# Patient Record
Sex: Male | Born: 1950
Health system: Southern US, Community
[De-identification: ages and names within clinical notes are randomized; demographics above are authoritative.]

## PROBLEM LIST (undated history)

## (undated) DIAGNOSIS — R7303 Prediabetes: Secondary | ICD-10-CM

## (undated) DIAGNOSIS — I10 Essential (primary) hypertension: Secondary | ICD-10-CM

## (undated) DIAGNOSIS — M1990 Unspecified osteoarthritis, unspecified site: Secondary | ICD-10-CM

## (undated) HISTORY — DX: Unspecified osteoarthritis, unspecified site: M19.90

## (undated) HISTORY — DX: Essential (primary) hypertension: I10

## (undated) HISTORY — DX: Prediabetes: R73.03

---

## 2008-06-17 DIAGNOSIS — I10 Essential (primary) hypertension: Secondary | ICD-10-CM | POA: Insufficient documentation

## 2008-06-17 DIAGNOSIS — M1990 Unspecified osteoarthritis, unspecified site: Secondary | ICD-10-CM | POA: Insufficient documentation

## 2008-06-17 HISTORY — DX: Essential (primary) hypertension: I10

## 2008-06-17 HISTORY — DX: Unspecified osteoarthritis, unspecified site: M19.90

## 2014-03-21 ENCOUNTER — Ambulatory Visit: Payer: Self-pay | Attending: Family Medicine

## 2014-03-25 ENCOUNTER — Encounter: Payer: Self-pay | Admitting: Internal Medicine

## 2014-03-25 ENCOUNTER — Ambulatory Visit: Payer: Self-pay | Attending: Internal Medicine | Admitting: Internal Medicine

## 2014-03-25 VITALS — BP 165/97 | HR 91 | Temp 98.1°F | Resp 16 | Wt 187.2 lb

## 2014-03-25 DIAGNOSIS — Z139 Encounter for screening, unspecified: Secondary | ICD-10-CM

## 2014-03-25 DIAGNOSIS — M069 Rheumatoid arthritis, unspecified: Secondary | ICD-10-CM | POA: Insufficient documentation

## 2014-03-25 DIAGNOSIS — Z87891 Personal history of nicotine dependence: Secondary | ICD-10-CM | POA: Insufficient documentation

## 2014-03-25 DIAGNOSIS — M2550 Pain in unspecified joint: Secondary | ICD-10-CM | POA: Insufficient documentation

## 2014-03-25 DIAGNOSIS — Z1211 Encounter for screening for malignant neoplasm of colon: Secondary | ICD-10-CM

## 2014-03-25 DIAGNOSIS — Z7689 Persons encountering health services in other specified circumstances: Secondary | ICD-10-CM | POA: Insufficient documentation

## 2014-03-25 DIAGNOSIS — I10 Essential (primary) hypertension: Secondary | ICD-10-CM | POA: Insufficient documentation

## 2014-03-25 LAB — URIC ACID: URIC ACID, SERUM: 6.4 mg/dL (ref 4.0–7.8)

## 2014-03-25 LAB — RHEUMATOID FACTOR: Rhuematoid fact SerPl-aCnc: 406 IU/mL — ABNORMAL HIGH (ref <=14)

## 2014-03-25 MED ORDER — LISINOPRIL 20 MG PO TABS: 20.0000 mg | ORAL_TABLET | Freq: Every day | ORAL | Status: DC

## 2014-03-25 NOTE — Patient Instructions (Signed)
Plan de alimentacin DASH (DASH Eating Plan) DASH es la sigla en ingls de "Enfoques Alimentarios para Detener la Hipertensin". El plan de alimentacin DASH ha demostrado bajar la presin arterial elevada (hipertensin). Los beneficios adicionales para la salud pueden incluir la disminucin del riesgo de diabetes mellitus tipo2, enfermedades cardacas e ictus. Este plan tambin puede ayudar a adelgazar. QU DEBO SABER ACERCA DEL PLAN DE ALIMENTACIN DASH? Para el plan de alimentacin DASH, seguir las siguientes pautas generales:  Elija los alimentos con un valor porcentual diario de sodio de menos del 5% (segn figura en la etiqueta del alimento).  Use hierbas o aderezos sin sal, en lugar de sal de mesa o sal marina.  Consulte al mdico o farmacutico antes de usar sustitutos de la sal.  Coma productos con bajo contenido de sodio, cuya etiqueta suele decir "bajo contenido de sodio" o "sin agregado de sal".  Coma alimentos frescos.  Coma ms verduras, frutas y productos lcteos con bajo contenido de grasas.  Elija los cereales integrales. Busque la palabra "integral" en el primer lugar de la lista de ingredientes.  Elija el pescado y el pollo o el pavo sin piel ms a menudo que las carnes rojas. Limite el consumo de pescado, carne de ave y carne a 6onzas (170g) por da.  Limite el consumo de dulces, postres, azcares y bebidas azucaradas.  Elija las grasas saludables para el corazn.  Limite el consumo de queso a 1onza (28g) por da.  Consuma ms comida casera y menos de restaurante, de buf y comida rpida.  Limite el consumo de alimentos fritos.  Cocine los alimentos utilizando mtodos que no sean la fritura.  Limite las verduras enlatadas. Si las consume, enjuguelas bien para disminuir el sodio.  Cuando coma en un restaurante, pida que preparen su comida con menos sal o, en lo posible, sin nada de sal. QU ALIMENTOS PUEDO COMER? Pida ayuda a un nutricionista para  conocer las necesidades calricas individuales. Cereales Pan de salvado o integral. Arroz integral. Pastas de salvado o integrales. Quinua, trigo burgol y cereales integrales. Cereales con bajo contenido de sodio. Tortillas de harina de maz o de salvado. Pan de maz integral. Galletas saladas integrales. Galletas con bajo contenido de sodio. Vegetales Verduras frescas o congeladas (crudas, al vapor, asadas o grilladas). Jugos de tomate y verduras con contenido bajo o reducido de sodio. Pasta y salsa de tomate con contenido bajo o reducido de sodio. Verduras enlatadas con bajo contenido de sodio o reducido de sodio.  Frutas Frutas frescas, en conserva (en su jugo natural) o frutas congeladas. Carnes y otros productos con protenas Carne de res molida (al 85% o ms magra), carne de res de animales alimentados con pastos o carne de res sin la grasa. Pollo o pavo sin piel. Carne de pollo o de pavo molida. Cerdo sin la grasa. Todos los pescados y frutos de mar. Huevos. Porotos, guisantes o lentejas secos. Frutos secos y semillas sin sal. Frijoles enlatados sin sal. Lcteos Productos lcteos con bajo contenido de grasas, como leche descremada o al 1%, quesos reducidos en grasas o al 2%, ricota con bajo contenido de grasas o queso cottage, o yogur natural con bajo contenido de grasas. Quesos con contenido bajo o reducido de sodio. Grasas y aceites Margarinas en barra que no contengan grasas trans. Mayonesa y alios para ensaladas livianos o reducidos en grasas (reducidos en sodio). [place]. Aceites de crtamo, oliva o canola. Mantequilla natural de man o almendra. Otros Palomitas de maz y pretzels sin sal.   Los artculos mencionados arriba pueden no ser una lista completa de las bebidas o los alimentos recomendados. Comunquese con el nutricionista para conocer ms opciones. QU ALIMENTOS NO SE RECOMIENDAN? Cereales Pan blanco. Pastas blancas. Arroz blanco. Pan de maz refinado. Bagels y  croissants. Galletas saladas que contengan grasas trans. Vegetales Vegetales con crema o fritos. Verduras en salsa de queso. Verduras enlatadas comunes. Pasta y salsa de tomate en lata comunes. Jugos comunes de tomate y de verduras. Frutas Frutas secas. Fruta enlatada en almbar liviano o espeso. Jugo de frutas. Carnes y otros productos con protenas Cortes de carne con grasa. Costillas, alas de pollo, tocineta, salchicha, mortadela, salame, chinchulines, tocino, perros calientes, salchichas alemanas y embutidos envasados. Frutos secos y semillas con sal. Frijoles con sal en lata. Lcteos Leche entera o al 2%, crema, mezcla de leche y crema, y queso crema. Yogur entero o endulzado. Quesos o queso azul con alto contenido de grasas. Cremas no lcteas y coberturas batidas. Quesos procesados, quesos para untar o cuajadas. Condimentos Sal de cebolla y ajo, sal condimentada, sal de mesa y sal marina. Salsas en lata y envasadas. Salsa Worcestershire. Salsa trtara. Salsa barbacoa. Salsa teriyaki. Salsa de soja, incluso la que tiene contenido reducido de sodio. Salsa de carne. Salsa de pescado. Salsa de ostras. Salsa rosada. Rbano picante. Ketchup y mostaza. Saborizantes y tiernizantes para carne. Caldo en cubitos. Salsa picante. Salsa tabasco. Adobos. Aderezos para tacos. Salsas. Grasas y aceites Mantequilla, margarina en barra, manteca de cerdo, grasa, mantequilla clarificada y grasa de tocino. Aceites de coco, de palmiste o de palma. Aderezos comunes para ensalada. Otros Pickles y aceitunas. Palomitas de maz y pretzels con sal. Los artculos mencionados arriba pueden no ser una lista completa de las bebidas y los alimentos que se deben evitar. Comunquese con el nutricionista para obtener ms informacin. DNDE PUEDO ENCONTRAR MS INFORMACIN? Instit[facility] Nacional del Corazn, del Pulmn y de la Sangre (National Heart, Lung, and Blood Institute):  www.nhlbi.nih.gov/health/health-topics/topics/dash/ Document Released: 05/23/2011 Document Revised: 10/18/2013 ExitCare Patient Information 2015 [employer]. This information is not intended to replace advice given to you by your health care provider. Make sure you discuss any questions you have with your health care provider.   

## 2014-03-25 NOTE — Progress Notes (Signed)
Patient Demographics  Armondo Cech, is a 63 y.o. male  JOA:[id number]  SWF:[id number]  DOB - 1951/01/10  CC:  Chief Complaint  Patient presents with  . Establish Care       HPI: Maximillian Habibi is a 63 y.o. male here today to establish medical care. He has to of hypertension as per patient for the last 2 months has ran out of his medication, he was taking lisinopril 20 mg daily, today's blood pressure is elevated, denies any headache dizziness chest and shortness of breath, he also reported to have history of rheumatoid arthritis, was seen specialist in the past and as per patient he also took methotrexate in the past he does have a deformity in the hands because of disease, occasionally has pain. As per patient he was also diagnosed with gout in the past. Patient has No headache, No chest pain, No abdominal pain - No Nausea, No new weakness tingling or numbness, No Cough - SOB.  No Known Allergies History reviewed. No pertinent past medical history. No current outpatient prescriptions on file prior to visit.   No current facility-administered medications on file prior to visit.   Family History  Problem Relation Age of Onset  . Heart disease Paternal Grandfather   . Arthritis Mother     Rheumatoid    History   Social History  . Marital Status: Married    Spouse Name: N/A    Number of Children: N/A  . Years of Education: N/A   Occupational History  . Not on file.   Social History Main Topics  . Smoking status: Former Games developer  . Smokeless tobacco: Not on file  . Alcohol Use: No     Comment: occasionally   . Drug Use: Not on file  . Sexual Activity: Not on file   Other Topics Concern  . Not on file   Social History Narrative  . No narrative on file    Review of Systems: Constitutional: Negative for fever, chills, diaphoresis, activity change, appetite change and fatigue. HENT: Negative for ear pain, nosebleeds, congestion, facial swelling,  rhinorrhea, neck pain, neck stiffness and ear discharge.  Eyes: Negative for pain, discharge, redness, itching and visual disturbance. Respiratory: Negative for cough, choking, chest tightness, shortness of breath, wheezing and stridor.  Cardiovascular: Negative for chest pain, palpitations and leg swelling. Gastrointestinal: Negative for abdominal distention. Genitourinary: Negative for dysuria, urgency, frequency, hematuria, flank pain, decreased urine volume, difficulty urinating and dyspareunia.  Musculoskeletal: Negative for back pain, joint swelling, arthralgia and gait problem. Neurological: Negative for dizziness, tremors, seizures, syncope, facial asymmetry, speech difficulty, weakness, light-headedness, numbness and headaches.  Hematological: Negative for adenopathy. Does not bruise/bleed easily. Psychiatric/Behavioral: Negative for hallucinations, behavioral problems, confusion, dysphoric mood, decreased concentration and agitation.    Objective:   Filed Vitals:   03/25/14 1521  BP: 165/97  Pulse: 91  Temp: 98.1 F (36.7 C)  Resp: 16    Physical Exam: Constitutional: Patient appears well-developed and well-nourished. No distress. HENT: Normocephalic, atraumatic, External right and left ear normal. Oropharynx is clear and moist.  Eyes: Conjunctivae and EOM are normal. PERRLA, no scleral icterus. Neck: Normal ROM. Neck supple. No JVD. No tracheal deviation. No thyromegaly. CVS: RRR, S1/S2 +, no murmurs, no gallops, no carotid bruit.  Pulmonary: Effort and breath sounds normal, no stridor, rhonchi, wheezes, rales.  Abdominal: Soft. BS +, no distension, tenderness, rebound or guarding.  Musculoskeletal: both hand swan neck deformities in the fingers, bilateral forearm on  the dorsal aspect palpable nodule nontender.  Neuro: Alert. Normal reflexes, muscle tone coordination. No cranial nerve deficit. Skin: Skin is warm and dry. No rash noted. Not diaphoretic. No erythema. No  pallor. Psychiatric: Normal mood and affect. Behavior, judgment, thought content normal.  No results found for this basename: WBC, HGB, HCT, MCV, PLT   No results found for this basename: CREATININE, BUN, NA, K, CL, CO2    No results found for this basename: HGBA1C   Lipid Panel  No results found for this basename: chol, trig, hdl, cholhdl, vldl, ldlcalc       Assessment and plan:   1. Essential hypertension Have resumed back on lisinopril, we'll do blood chemistry - COMPLETE METABOLIC PANEL WITH GFR; Future - lisinopril (PRINIVIL,ZESTRIL) 20 MG tablet; Take 1 tablet (20 mg total) by mouth daily.  Dispense: 30 tablet; Refill: 3  2. Screening Ordered baseline fasting blood work. - CBC with Differential; Future - Lipid panel; Future - TSH; Future - Vit D  25 hydroxy (rtn osteoporosis monitoring); Future  3. Special screening for malignant neoplasms, colon  - Ambulatory referral to Gastroenterology  4. Rheumatoid arthritis  - Ambulatory referral to Rheumatology - Uric Acid - Rheumatoid factor  5. Joint pain Tylenol/ ibuprofen prn      Health Maintenance -Colonoscopy: referred to GI  -Vaccinations:  patient is uptodate with flu shot  Return in about 3 months (around 06/25/2014) for hypertension, BP check and fasting blood work in 2 weeks/Nurse Visit.   Doris Cheadle, MD

## 2014-03-25 NOTE — Progress Notes (Signed)
Patient here to establish care Taking medication for HTN

## 2014-04-04 ENCOUNTER — Telehealth: Payer: Self-pay | Admitting: *Deleted

## 2014-04-04 NOTE — Telephone Encounter (Signed)
Message copied by Dyann Kief on Mon Apr 04, 2014  2:26 PM ------      Message from: Doris Cheadle      Created: Mon Apr 04, 2014 11:16 AM       Call and let the patient know that his uric acid is in normal range but his rheumatoid factor is elevated, patient has already been referred to rheumatology, advise patient to make appointment with the specialist. ------

## 2014-04-04 NOTE — Telephone Encounter (Signed)
Unable to LVM, voice message not set up yet

## 2016-04-29 ENCOUNTER — Ambulatory Visit: Payer: Self-pay | Attending: Internal Medicine

## 2016-06-27 ENCOUNTER — Ambulatory Visit (INDEPENDENT_AMBULATORY_CARE_PROVIDER_SITE_OTHER): Payer: Self-pay | Admitting: Internal Medicine

## 2016-06-27 ENCOUNTER — Encounter: Payer: Self-pay | Admitting: Internal Medicine

## 2016-06-27 VITALS — BP 140/82 | HR 72 | Resp 12 | Ht 69.0 in | Wt 190.0 lb

## 2016-06-27 DIAGNOSIS — R102 Pelvic and perineal pain: Secondary | ICD-10-CM

## 2016-06-27 DIAGNOSIS — M0579 Rheumatoid arthritis with rheumatoid factor of multiple sites without organ or systems involvement: Secondary | ICD-10-CM

## 2016-06-27 DIAGNOSIS — Z23 Encounter for immunization: Secondary | ICD-10-CM

## 2016-06-27 DIAGNOSIS — N390 Urinary tract infection, site not specified: Secondary | ICD-10-CM

## 2016-06-27 DIAGNOSIS — Z79899 Other long term (current) drug therapy: Secondary | ICD-10-CM

## 2016-06-27 DIAGNOSIS — I10 Essential (primary) hypertension: Secondary | ICD-10-CM

## 2016-06-27 DIAGNOSIS — R8281 Pyuria: Secondary | ICD-10-CM

## 2016-06-27 LAB — POCT URINALYSIS DIPSTICK
Bilirubin, UA: NEGATIVE
Glucose, UA: NEGATIVE
Ketones, UA: NEGATIVE
NITRITE UA: NEGATIVE
PH UA: 5
Protein, UA: 15
RBC UA: NEGATIVE
Spec Grav, UA: 1.015
Urobilinogen, UA: 0.2

## 2016-06-27 MED ORDER — LISINOPRIL 10 MG PO TABS: 10.0000 mg | ORAL_TABLET | Freq: Every day | ORAL | 11 refills | Status: DC

## 2016-06-27 NOTE — Progress Notes (Signed)
Subjective:    Patient ID: Bill Gutierrez, male    DOB: [birth year], 66 y.o.   MRN: [id number]  HPI   Here to establish  Very difficult history--meandering.  1.  Rheumatoid Arthritis:  Patient states diagnosed 8 years ago, but also that he was last treated 8 years ago.  Unable to clarify.  Previously followed by a Rheumatologist in [place].  Not clear if through [facility] and no chart from [facility] in August.  Went to this practice for 4 years.  Diagnosed at a general medicine clinic, [place], [place]. Last time he was seen was 8 years ago--2010.   Previously taking Methotrexate ?14 mg  and Folic Acid 1 mg once weekly. Seemed to have side effects with the MTX:  Describes conjunctivitis and increased joint pain, panic attacks. Not clear why medication never changed. Stopped taking when he ran out of the refills. Tried "natural" and homeopathic medication.  At some point, the pain and swelling and subcutaneous nodules of arms went away.  He does have significant deformities of fingers and hands. Has not had labs done for 2 years. Labs last done in  [place] on [address]  [facility] --not there any longer.  Does not need to take anything for the pain currently.    2.  Essential Hypertension:  Diagnosed 2010.  Last time he took anything for his bp was 1 month ago. Was taking Lisinopril 20 mg daily.  Feels he needs a lower dose.  Unclear why--perhaps because he feels it makes him urinate at night.  3.  Pelvic Pain:  Suprapubic area.  Describes the pain as a pressure.  When he loosens his pants, does not bother him.  Sounds like this started at least 2 years ago and underwent ultrasound, digital rectal exam both of which were normal.  Did have blood in urine on UA.  Treated with antibiotics and did not improve the discomfort.  Not clear what the evaluation was complete. Was sent for a colonoscopy, but could not afford.  This was with one of the [place] Then  went to [facility].  No outpatient prescriptions have been marked as taking for the 06/27/16 encounter (Office Visit) with Julieanne Manson, MD.    No Known Allergies   Past Medical History:  Diagnosis Date  . Hypertension 2010  . rheumatoid 2010   History reviewed. No pertinent surgical history.   Review of Systems     Objective:   Physical Exam  NAD HEENT:  PERRL, EOMI, TMs pearly gray, throat without injection Neck:  Supple, No adenopathy, no thyromegaly Chest:  CTA CV:  RRR with normal S1 and S2, No S3, S4 or murmur, radial and DP pulses normal and equal Abd;  S, Mild tenderness lower abdomen in midline suprapubic area, no rebound, peritoneal sign, No HSM or mass, + BS MS:  Significant swan neck deformities of fingers, with enlarged MCPs and ulnar deviation of digits.  Non-inflamed nodule on left olecranon. Deformity of toes as well.  Wrists hypertrophic as well.  Palpable soft tissue swelling.  No erythema of joints.         Assessment & Plan:  1.  Rheumatoid Arthritis:  High RF 2 years ago, above 400.  Check sed rate.  Appears to be quiescent with significant deformities.   Consider referral to [facility] if need for treatment  2.  Essential Hypertension: Borderline bp today.  Try decreased dose of Lisinopril 10 mg  daily. CMP  3.  Suprapubic discomfort, which appears quite mild.  UA with small leuks.  Sending for culture.  4.  HM:  Tdap today.

## 2016-06-27 NOTE — Patient Instructions (Signed)
Sign releases of info at check out--call back with name of doctors you used to go to and we will send for records:  [facility], Doctor in [place].

## 2016-06-28 LAB — COMPREHENSIVE METABOLIC PANEL
ALBUMIN: 4.1 g/dL (ref 3.6–4.8)
ALT: 24 IU/L (ref 0–44)
AST: 24 IU/L (ref 0–40)
Albumin/Globulin Ratio: 1 — ABNORMAL LOW (ref 1.2–2.2)
Alkaline Phosphatase: 105 IU/L (ref 39–117)
BUN / CREAT RATIO: 17 (ref 10–24)
BUN: 14 mg/dL (ref 8–27)
Bilirubin Total: 0.2 mg/dL (ref 0.0–1.2)
CO2: 23 mmol/L (ref 18–29)
Calcium: 9.6 mg/dL (ref 8.6–10.2)
Chloride: 96 mmol/L (ref 96–106)
Creatinine, Ser: 0.82 mg/dL (ref 0.76–1.27)
GFR calc non Af Amer: 94 mL/min/{1.73_m2} (ref >59)
GFR, EST AFRICAN AMERICAN: 109 mL/min/{1.73_m2} (ref >59)
GLOBULIN, TOTAL: 4.1 g/dL (ref 1.5–4.5)
Glucose: 116 mg/dL — ABNORMAL HIGH (ref 65–99)
Potassium: 4.3 mmol/L (ref 3.5–5.2)
Sodium: 136 mmol/L (ref 134–144)
Total Protein: 8.2 g/dL (ref 6.0–8.5)

## 2016-06-28 LAB — SEDIMENTATION RATE: Sed Rate: 40 mm/hr — ABNORMAL HIGH (ref 0–30)

## 2016-06-29 LAB — URINE CULTURE: Organism ID, Bacteria: NO GROWTH

## 2016-07-31 NOTE — Progress Notes (Signed)
Patient was given results from labs and has an appoint scheduled for 08/01/16 to check A1C

## 2016-08-01 ENCOUNTER — Other Ambulatory Visit (INDEPENDENT_AMBULATORY_CARE_PROVIDER_SITE_OTHER): Payer: Self-pay

## 2016-08-01 DIAGNOSIS — R739 Hyperglycemia, unspecified: Secondary | ICD-10-CM

## 2016-08-02 LAB — HGB A1C W/O EAG: Hgb A1c MFr Bld: 5.8 % — ABNORMAL HIGH (ref 4.8–5.6)

## 2016-08-14 NOTE — Progress Notes (Signed)
Patient was called with lab results and follow up appointment information

## 2016-08-29 ENCOUNTER — Ambulatory Visit (INDEPENDENT_AMBULATORY_CARE_PROVIDER_SITE_OTHER): Payer: Self-pay | Admitting: Internal Medicine

## 2016-08-29 ENCOUNTER — Encounter: Payer: Self-pay | Admitting: Internal Medicine

## 2016-08-29 VITALS — BP 142/82 | HR 80 | Resp 12 | Ht 69.25 in | Wt 191.0 lb

## 2016-08-29 DIAGNOSIS — M0579 Rheumatoid arthritis with rheumatoid factor of multiple sites without organ or systems involvement: Secondary | ICD-10-CM

## 2016-08-29 DIAGNOSIS — I10 Essential (primary) hypertension: Secondary | ICD-10-CM

## 2016-08-29 DIAGNOSIS — R7303 Prediabetes: Secondary | ICD-10-CM

## 2016-08-29 DIAGNOSIS — R8290 Unspecified abnormal findings in urine: Secondary | ICD-10-CM

## 2016-08-29 HISTORY — DX: Prediabetes: R73.03

## 2016-08-29 NOTE — Patient Instructions (Signed)

## 2016-08-29 NOTE — Progress Notes (Signed)
   Subjective:    Patient ID: Bill Gutierrez, male    DOB: [birth year], 66 y.o.   MRN: [id number]  HPI   1.  RA:  Sed Rate was just above normal at 40.  Discussed RA does not appear to be active.  Stated at last visit he was not really symptomatic.  Continues on some sort of herbal remedy of which I am unable to interpret in [place]. No real pain, but does have old deformities.     2.  Prediabetes:  A1C was 5.8% with labs in January.  Checked this due to elevated fasting blood sugar.  Describes drinking juices with vegetables and fruits--juices these foods, does not blend them with the whole fruit.  Has really stopped drinking juices.    Eats twice daily  Morning:  Rice and beans and vegetables, lamb or chicken.  Evening:  Similar meal.    Eats a lot of bread.  Current physical activity:  Nothing.  Discussed swimming or stationary bike.  3.  Low pelvic/suprapubic pain:  Urine was negative.  Does states has a lot of bubbles in urine.  Has noted this for 2 years.    4.  Essential Hypertension:  Not taking his Lisinopril every day.  He is deciding whether to take his medicine on a day to day basis --not clear what makes him decide to take the medicine or not.  Current Meds  Medication Sig  . lisinopril (PRINIVIL,ZESTRIL) 10 MG tablet Take 1 tablet (10 mg total) by mouth daily.    No Known Allergies             Review of Systems     Objective:   Physical Exam  NAD Lungs:  CTA CV:  RRR without murmur or rub, radial pulses normal and equal.  LE:  No edema.        Assessment & Plan:  1. RA: appears to be quiescent currently.  Patient to bring in all supplements to see what he is taking and will decide what to do with those--natural alternatives or not.  2.  Essential Hypertension: discussed he needs to take this once daily and not skip.  Need to see if taken correctly if will control bp.  3.  Prediabetes:  Long discussion regarding diet and finding something like  pool exercise or swimming to be physically active without damaging joints further.    4.  Bubbles in urine:  Check urine microalbumin/crea ratio.  Suspect will be normal. Follow up in 3 months for CPE.

## 2016-08-30 LAB — MICROALBUMIN / CREATININE URINE RATIO
Creatinine, Urine: 141.8 mg/dL
MICROALBUM., U, RANDOM: 3.9 ug/mL
Microalb/Creat Ratio: 2.8 mg/g creat (ref 0.0–30.0)

## 2016-10-05 ENCOUNTER — Emergency Department (HOSPITAL_COMMUNITY): Payer: Self-pay

## 2016-10-05 ENCOUNTER — Emergency Department (HOSPITAL_COMMUNITY)
Admission: EM | Admit: 2016-10-05 | Discharge: 2016-10-05 | Disposition: A | Discharge status: Home / Self Care | Payer: Self-pay | Attending: Emergency Medicine | Admitting: Emergency Medicine

## 2016-10-05 DIAGNOSIS — Z79899 Other long term (current) drug therapy: Secondary | ICD-10-CM | POA: Insufficient documentation

## 2016-10-05 DIAGNOSIS — I10 Essential (primary) hypertension: Secondary | ICD-10-CM | POA: Insufficient documentation

## 2016-10-05 DIAGNOSIS — R002 Palpitations: Secondary | ICD-10-CM

## 2016-10-05 DIAGNOSIS — F419 Anxiety disorder, unspecified: Secondary | ICD-10-CM | POA: Insufficient documentation

## 2016-10-05 DIAGNOSIS — Z87891 Personal history of nicotine dependence: Secondary | ICD-10-CM | POA: Insufficient documentation

## 2016-10-05 LAB — BASIC METABOLIC PANEL
Anion gap: 10 (ref 5–15)
BUN: 12 mg/dL (ref 6–20)
CALCIUM: 8.9 mg/dL (ref 8.9–10.3)
CO2: 21 mmol/L — ABNORMAL LOW (ref 22–32)
CREATININE: 0.73 mg/dL (ref 0.61–1.24)
Chloride: 101 mmol/L (ref 101–111)
GFR calc non Af Amer: >60 mL/min (ref >60)
Glucose, Bld: 181 mg/dL — ABNORMAL HIGH (ref 65–99)
Potassium: 3.7 mmol/L (ref 3.5–5.1)
SODIUM: 132 mmol/L — AB (ref 135–145)

## 2016-10-05 LAB — CBC
HCT: 41 % (ref 39.0–52.0)
Hemoglobin: 13.3 g/dL (ref 13.0–17.0)
MCH: 24.2 pg — AB (ref 26.0–34.0)
MCHC: 32.4 g/dL (ref 30.0–36.0)
MCV: 74.5 fL — ABNORMAL LOW (ref 78.0–100.0)
PLATELETS: 398 10*3/uL (ref 150–400)
RBC: 5.5 MIL/uL (ref 4.22–5.81)
RDW: 14.2 % (ref 11.5–15.5)
WBC: 9.6 10*3/uL (ref 4.0–10.5)

## 2016-10-05 LAB — I-STAT TROPONIN, ED: Troponin i, poc: 0 ng/mL (ref 0.00–0.08)

## 2016-10-05 MED ORDER — LISINOPRIL 10 MG PO TABS
10.0000 mg | ORAL_TABLET | Freq: Once | ORAL | Status: DC
  Filled 2016-10-05: qty 1

## 2016-10-05 NOTE — Discharge Instructions (Signed)
As discussed, I would like you to increase your lisinopril to 20 mg daily.  Take this at the same time.  He normally do and only take your blood pressure 1 time a day, approximately 3-4 hours after your medication make an appointment with your primary care physician to be seen.  The end of next week   Como se discuti, me gustara que aumente su lisinopril a 20 mg al da. Toma esto al [employer]. Normalmente lo hace y solo toma su presin arterial 1 vez al da, aproximadamente 3-4 horas despus de su medicacin, haga una cita con su mdico de atencin primaria para que lo vean. El fin de la prxima semana

## 2016-10-05 NOTE — ED Provider Notes (Signed)
[facility]-EMERGENCY DEPT Provider Note   CSN: [id number] Arrival date & time: 10/05/16  1759     History   Chief Complaint Chief Complaint  Patient presents with  . Chest Pain  . Palpitations  . Shortness of Breath    HPI Bill Gutierrez is a 66 y.o. male.  (Interpreter services were used during interview )This is a soft and extremely anxious 66 year old Hispanic male who comes in reporting that he's been having palpitations on and off for the past 2 weeks and his blood pressure has been elevated 150/90 at home.  He's taken a second lisinopril 10 mg tablet occasionally to get his blood pressure daily down.  He reports that he takes his blood pressure numerous times in a row.  If there is a high reading. Denies any shortness of breath, diaphoresis, nausea, peripheral swelling, chest pain. As endorse, rheumatoid arthritis pain in his arms, shoulders and back, for which she is taking homeopathic medications.  At one point he was taking methotrexate but he didn't like the side effects or stop this per his rheumatologist.      Past Medical History:  Diagnosis Date  . Hypertension 2010  . Prediabetes 08/29/2016  . rheumatoid 2010    Patient Active Problem List   Diagnosis Date Noted  . Prediabetes 08/29/2016  . Essential hypertension 03/25/2014  . Rheumatoid arthritis (HCC) 03/25/2014  . rheumatoid 06/17/2008  . Hypertension 06/17/2008    No past surgical history on file.     Home Medications    Prior to Admission medications   Medication Sig Start Date End Date Taking? Authorizing Provider  lisinopril (PRINIVIL,ZESTRIL) 10 MG tablet Take 1 tablet (10 mg total) by mouth daily. 06/27/16   Julieanne Manson, MD    Family History Family History  Problem Relation Age of Onset  . Heart disease Paternal Grandfather   . Arthritis Mother     Rheumatoid   . Mental illness Sister     Social History Social History  Substance Use Topics  . Smoking status: Former  Smoker    Years: 15.00    Types: Cigarettes  . Smokeless tobacco: Never Used  . Alcohol use No     Comment: Stopped drinking completely when RA     Allergies   Patient has no known allergies.   Review of Systems Review of Systems  Constitutional: Negative for chills, fatigue and fever.  Respiratory: Negative for shortness of breath.   Cardiovascular: Positive for palpitations. Negative for chest pain.  Musculoskeletal: Positive for arthralgias and back pain.  All other systems reviewed and are negative.    Physical Exam Updated Vital Signs BP (!) 157/98   Pulse 91   Temp 97.7 F (36.5 C) (Oral)   Resp 10   SpO2 97%   Physical Exam  Constitutional: He is oriented to person, place, and time. He appears well-developed and well-nourished. No distress.  HENT:  Head: Normocephalic.  Right Ear: External ear normal.  Left Ear: External ear normal.  Eyes: Pupils are equal, round, and reactive to light.  Neck: Normal range of motion.  Cardiovascular: Normal rate.  Exam reveals no gallop.   No murmur heard. Pulmonary/Chest: Effort normal.  Abdominal: Soft.  Neurological: He is alert and oriented to person, place, and time.  Nursing note and vitals reviewed.    ED Treatments / Results  Labs (all labs ordered are listed, but only abnormal results are displayed) Labs Reviewed  BASIC METABOLIC PANEL - Abnormal; Notable for the following:  Result Value   Sodium 132 (*)    CO2 21 (*)    Glucose, Bld 181 (*)    All other components within normal limits  CBC - Abnormal; Notable for the following:    MCV 74.5 (*)    MCH 24.2 (*)    All other components within normal limits  I-STAT TROPOININ, ED    EKG  EKG Interpretation None       Radiology Dg Chest 2 View  Result Date: 10/05/2016 CLINICAL DATA:  Mid thoracic back pain and palpitations today. Hypertension. Ex-smoker. EXAM: CHEST  2 VIEW COMPARISON:  None. FINDINGS: Normal sized heart. Tortuous aorta and  innominate artery. Clear lungs. Mild diffuse peribronchial thickening. Diffuse osteopenia. IMPRESSION: No acute abnormality.  Mild chronic bronchitic changes. Electronically Signed   By: Beckie Salts M.D.   On: 10/05/2016 18:26    Procedures Procedures (including critical care time)  Medications Ordered in ED Medications  lisinopril (PRINIVIL,ZESTRIL) tablet 10 mg (not administered)     Initial Impression / Assessment and Plan / ED Course  I have reviewed the triage vital signs and the nursing notes.  Pertinent labs & imaging results that were available during my care of the patient were reviewed by me and considered in my medical decision making (see chart for details).      Reviewed all of his labs his EKG and chest x-ray with the help of the interpreter if reassured this patient. I've instructed the patient to increase his lisinopril to 20 mg daily.  He states he normally takes his medication at 3 PM, which is fine.  I want to take his blood pressure once a day between 7 and 8 at night and keep her documentation of this and make an appointment with his primary care physician to be seen.  The end of next week.  Final Clinical Impressions(s) / ED Diagnoses   Final diagnoses:  Palpitations  Anxiety  Hypertension, unspecified type    New Prescriptions New Prescriptions   No medications on file     Earley Favor, NP 10/05/16 2204    Alvira Monday, MD 10/07/16 1210

## 2016-10-05 NOTE — ED Triage Notes (Signed)
Pt reports mid chest discomfort and palpitations x 2 days with mild sob. ekg done at triage and no resp distress noted.

## 2016-11-12 ENCOUNTER — Encounter: Payer: Self-pay | Admitting: Pediatric Intensive Care

## 2016-11-12 DIAGNOSIS — I10 Essential (primary) hypertension: Secondary | ICD-10-CM

## 2016-11-14 LAB — GLUCOSE, POCT (MANUAL RESULT ENTRY): POC Glucose: 109 mg/dl — AB (ref 70–99)

## 2016-11-14 NOTE — Congregational Nurse Program (Signed)
Congregational Nurse Program Note  Date of Encounter: 11/12/2016  Past Medical History: Past Medical History:  Diagnosis Date  . Hypertension 2010  . Prediabetes 08/29/2016  . rheumatoid 2010    Encounter Details:     CNP Questionnaire - 11/12/16 1600      Patient Demographics   Is this a new or existing patient? 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 Not Applicable   Does patient have a medical provider? Yes   Patient referred to Follow up with established PCP   Was a mental health screening completed? (GAINS tool) No   Does patient have dental issues? No   Does patient have vision issues? No   Does your patient have an abnormal blood pressure today? Yes   Since previous encounter, have you referred patient for abnormal blood pressure that resulted in a new diagnosis or medication change? No   Does your patient have an abnormal blood glucose today? No   Since previous encounter, have you referred patient for abnormal blood glucose that resulted in a new diagnosis or medication change? No   Was there a life-saving intervention made? No     Via interpreter Marchelle Folks- client in for BP/BG check. States he has history of hypertension and takes lisinopril. He is a patient at [facility] . Client will follow up with PCP on scheduled appointment date. Return to CN clinic as needed  for BP check.

## 2016-12-05 ENCOUNTER — Encounter: Payer: Self-pay | Admitting: Internal Medicine

## 2017-01-16 ENCOUNTER — Encounter: Payer: Self-pay | Admitting: Internal Medicine

## 2017-03-27 ENCOUNTER — Ambulatory Visit: Payer: Self-pay | Admitting: Internal Medicine

## 2017-07-04 ENCOUNTER — Ambulatory Visit: Payer: Self-pay | Attending: Internal Medicine

## 2017-09-15 ENCOUNTER — Encounter (INDEPENDENT_AMBULATORY_CARE_PROVIDER_SITE_OTHER): Payer: Self-pay | Admitting: Physician Assistant

## 2017-09-15 ENCOUNTER — Ambulatory Visit: Payer: Self-pay | Attending: Physician Assistant

## 2017-09-15 ENCOUNTER — Ambulatory Visit (INDEPENDENT_AMBULATORY_CARE_PROVIDER_SITE_OTHER): Payer: Self-pay | Admitting: Physician Assistant

## 2017-09-15 ENCOUNTER — Other Ambulatory Visit: Payer: Self-pay

## 2017-09-15 VITALS — BP 150/87 | HR 64 | Temp 97.4°F | Ht 70.0 in | Wt 170.4 lb

## 2017-09-15 DIAGNOSIS — I10 Essential (primary) hypertension: Secondary | ICD-10-CM

## 2017-09-15 DIAGNOSIS — M0579 Rheumatoid arthritis with rheumatoid factor of multiple sites without organ or systems involvement: Secondary | ICD-10-CM

## 2017-09-15 DIAGNOSIS — R1024 Suprapubic pain: Secondary | ICD-10-CM

## 2017-09-15 DIAGNOSIS — R634 Abnormal weight loss: Secondary | ICD-10-CM

## 2017-09-15 DIAGNOSIS — R102 Pelvic and perineal pain: Secondary | ICD-10-CM

## 2017-09-15 LAB — POCT URINALYSIS DIPSTICK
Bilirubin, UA: NEGATIVE
Glucose, UA: NEGATIVE
Ketones, UA: NEGATIVE
Leukocytes, UA: NEGATIVE
Nitrite, UA: NEGATIVE
PH UA: 7 (ref 5.0–8.0)
PROTEIN UA: NEGATIVE
Spec Grav, UA: 1.015 (ref 1.010–1.025)
UROBILINOGEN UA: 0.2 U/dL

## 2017-09-15 MED ORDER — FOLIC ACID 1 MG PO TABS: 1.0000 mg | ORAL_TABLET | Freq: Every day | ORAL | 2 refills | Status: DC

## 2017-09-15 MED ORDER — SULFASALAZINE 500 MG PO TABS: 500.0000 mg | ORAL_TABLET | Freq: Every day | ORAL | 2 refills | Status: DC

## 2017-09-15 MED FILL — FOLIC ACID 1 MG TABLET: 1 | 30 days supply | Qty: 30 | Fill #0

## 2017-09-15 MED FILL — sulfaSALAzine 500 MG TABS: 500 | 30 days supply | Qty: 30 | Fill #0

## 2017-09-15 NOTE — Progress Notes (Signed)
Subjective:  Patient ID: Bill Gutierrez, male    DOB: 1951-04-11  Age: 67 y.o. MRN: [id number]  CC: HTN and RA. Lower abd pain  HPI Bill Gutierrez is a 67 y.o. male with a medical history of anixety, palpitations, HTN, prediabetes, and rheumatoid arthritis presents as a new patient with complaint of lower abdominal pain. Described as "something that does not belong in my body". Sensation felt in the suprapubic region. Does not describe urinary difficulty but does describe occasional difficulty defecating.. Had a colonoscopy ordered before but says he did not have the money to pay for the procedure and never had done. Can not work due to his RA. Endorses weight loss from "36 to 34", referring to his pant waist size. Few episodes of night sweats attributed to past medication use.      In regards to RA, patient can not work due to he hand pain and deformity. Had tried Methotrexate and Folic Acid but there were side effects to his vision. Was advised to use Naproxen but he did not do so. Pains have been felt in the knees, shoulder, elbows, and hips. Current overall pain level 0/10.      BP noted to be elevated today but he says that he has bought a blood pressure monitor to use a home. Tests BP TID and reports pressures in the 110s/70s at home. Does not endorse CP, palpitations, SOB, HA, abdominal pain, tingling, numbness, weakness, f/c/n/v, or rash.      Outpatient Medications Prior to Visit  Medication Sig Dispense Refill  . lisinopril (PRINIVIL,ZESTRIL) 10 MG tablet Take 1 tablet (10 mg total) by mouth daily. 30 tablet 11   No facility-administered medications prior to visit.      ROS Review of Systems  Constitutional: Positive for diaphoresis and weight loss. Negative for chills, fever and malaise/fatigue.  Eyes: Negative for blurred vision.  Respiratory: Negative for shortness of breath.   Cardiovascular: Negative for chest pain and palpitations.  Gastrointestinal: Positive  for constipation. Negative for abdominal pain, nausea and vomiting.  Genitourinary: Negative for dysuria and hematuria.  Musculoskeletal: Positive for joint pain. Negative for myalgias.  Skin: Negative for rash.  Neurological: Negative for tingling and headaches.  Psychiatric/Behavioral: Negative for depression. The patient is not nervous/anxious.     Objective:  BP (!) 150/87 (BP Location: Left Arm, Patient Position: Sitting, Cuff Size: Normal)   Pulse 64   Temp (!) 97.4 F (36.3 C) (Oral)   Ht 5\' 10"  (1.778 m)   Wt 170 lb 6.4 oz (77.3 kg)   SpO2 98%   BMI 24.45 kg/m   BP/Weight 09/15/2017 11/12/2016 10/05/2016  Systolic BP 150 140 157  Diastolic BP 87 91 98  Wt. (Lbs) 170.4 - -  BMI 24.45 - -      Physical Exam  Constitutional: He is oriented to person, place, and time.  Well developed, thin, NAD, polite, articulate  HENT:  Head: Normocephalic and atraumatic.  Eyes: No scleral icterus.  Neck: Normal range of motion. Neck supple. No thyromegaly present.  Cardiovascular: Normal rate, regular rhythm and normal heart sounds.  Pulmonary/Chest: Effort normal and breath sounds normal. No respiratory distress. He has no wheezes.  Abdominal: Soft. Bowel sounds are normal. There is tenderness (mild suprapubic TTP with firm mass felt over suprapubic region).  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 He exhibits no edema.  Ulnar deviation of the fingers bilaterally with bony hypertrophy of the  MCPjs and PIPjs  Neurological: He is alert and oriented to person, place, and time.  Skin: Skin is warm and dry. No rash noted. No erythema. No pallor.  Psychiatric: He has a normal mood and affect. His behavior is normal. Thought content normal.  Vitals reviewed.    Assessment & Plan:    1. Rheumatoid arthritis involving multiple sites with positive rheumatoid factor (HCC) - Sedimentation Rate; Future - C-reactive protein;  Future - Hepatitis panel, acute; Future - QuantiFERON-TB Gold Plus; Future - CYCLIC CITRUL PEPTIDE ANTIBODY, IGG/IGA - DG Chest 2 View; Future - sulfaSALAzine (AZULFIDINE) 500 MG tablet; Take 1 tablet (500 mg total) by mouth daily.  Dispense: 30 tablet; Refill: 2 - folic acid (FOLVITE) 1 MG tablet; Take 1 tablet (1 mg total) by mouth daily.  Dispense: 30 tablet; Refill: 2 - Ambulatory referral to Rheumatology  2. Suprapubic abdominal pain - PSA; Future - Urinalysis Dipstick with moderate blood - Fecal occult blood, imunochemical - Comprehensive metabolic panel; Future - CBC with Differential; Future  3. Essential hypertension - Comprehensive metabolic panel; Future - CBC with Differential; Future - TSH; Future  4. Loss of weight - Need to assess PSA and FIT     Meds ordered this encounter  Medications  . sulfaSALAzine (AZULFIDINE) 500 MG tablet    Sig: Take 1 tablet (500 mg total) by mouth daily.    Dispense:  30 tablet    Refill:  2    Order Specific Question:   Supervising Provider    Answer:   Quentin Angst [id number]  . folic acid (FOLVITE) 1 MG tablet    Sig: Take 1 tablet (1 mg total) by mouth daily.    Dispense:  30 tablet    Refill:  2    Order Specific Question:   Supervising Provider    Answer:   Quentin Angst [[id number]]    Follow-up:  4 weeks  Loletta Specter PA

## 2017-09-15 NOTE — Patient Instructions (Signed)
   Artritis reumatoide (Rheumatoid Arthritis) La artritis reumatoide (AR) es una enfermedad a largo plazo (crnica). La AR causa inflamacin en las articulaciones. Las articulaciones pueden estar doloridas, rgidas, hinchadas, calientes o sensibles a la palpacin. La AR puede comenzar lentamente. A menudo, afecta las articulaciones pequeas de las manos y los pies. Tambin puede afectar otras partes del cuerpo, incluso el corazn, los ojos o los pulmones. Los sntomas de la AR suelen ser intermitentes. En ocasiones, los sntomas empeoran durante un perodo. Estos perodos se denominan crisis. La AR no tiene [place], pero el mdico trabajar con usted para encontrar la opcin de tratamiento ms adecuada para su caso. Esto depender de la evolucin de la enfermedad en su organismo. CUIDADOS EN EL HOGAR  Tome los medicamentos de venta libre y los recetados solamente como se lo haya indicado el mdico. El mdico puede ajustarle los medicamentos cada 3meses.  Comience un programa de actividad fsica segn las indicaciones del mdico.  Descanse cuando tenga una crisis.  Retome sus actividades habituales como se lo haya indicado el mdico. Pregntele al mdico qu actividades son seguras para usted.  Concurra a todas las visitas de control como se lo haya indicado el mdico. Esto es importante. SOLICITE AYUDA SI:  Tiene una crisis.  Tiene fiebre.  Tiene problemas (efectos secundarios) debido a los medicamentos. SOLICITE AYUDA DE INMEDIATO SI:  Siente dolor en el pecho.  Tiene dificultad para respirar.  Tiene calor y dolor en una articulacin de forma repentina, y esto es ms intenso que sus dolores articulares habituales. Esta informacin no tiene como fin reemplazar el consejo del mdico. Asegrese de hacerle al mdico cualquier pregunta que tenga. Document Released: 12/03/2011 Document Revised: 09/25/2015 Document Reviewed: 03/16/2015 Elsevier Interactive Patient Education  2018 Elsevier  Inc.  

## 2017-09-18 LAB — QUANTIFERON-TB GOLD PLUS
QuantiFERON Mitogen Value: 7.42 IU/mL
QuantiFERON Nil Value: 0.01 IU/mL
QuantiFERON TB1 Ag Value: 0.02 IU/mL
QuantiFERON TB2 Ag Value: 0.02 IU/mL
QuantiFERON-TB Gold Plus: NEGATIVE

## 2017-09-18 LAB — CBC WITH DIFFERENTIAL/PLATELET
BASOS: 0 %
Basophils Absolute: 0 10*3/uL (ref 0.0–0.2)
EOS (ABSOLUTE): 0.2 10*3/uL (ref 0.0–0.4)
EOS: 2 %
HEMATOCRIT: 43.5 % (ref 37.5–51.0)
HEMOGLOBIN: 14.1 g/dL (ref 13.0–17.7)
Immature Grans (Abs): 0 10*3/uL (ref 0.0–0.1)
Immature Granulocytes: 0 %
LYMPHS ABS: 1.2 10*3/uL (ref 0.7–3.1)
Lymphs: 15 %
MCH: 24.5 pg — ABNORMAL LOW (ref 26.6–33.0)
MCHC: 32.4 g/dL (ref 31.5–35.7)
MCV: 76 fL — AB (ref 79–97)
MONOCYTES: 5 %
MONOS ABS: 0.4 10*3/uL (ref 0.1–0.9)
Neutrophils Absolute: 6.6 10*3/uL (ref 1.4–7.0)
Neutrophils: 78 %
Platelets: 294 10*3/uL (ref 150–379)
RBC: 5.76 x10E6/uL (ref 4.14–5.80)
RDW: 15.5 % — AB (ref 12.3–15.4)
WBC: 8.4 10*3/uL (ref 3.4–10.8)

## 2017-09-18 LAB — COMPREHENSIVE METABOLIC PANEL
A/G RATIO: 1.1 — AB (ref 1.2–2.2)
ALBUMIN: 4 g/dL (ref 3.6–4.8)
ALK PHOS: 112 IU/L (ref 39–117)
ALT: 16 IU/L (ref 0–44)
AST: 19 IU/L (ref 0–40)
BUN / CREAT RATIO: 16 (ref 10–24)
BUN: 11 mg/dL (ref 8–27)
Bilirubin Total: 0.3 mg/dL (ref 0.0–1.2)
CALCIUM: 9 mg/dL (ref 8.6–10.2)
CO2: 21 mmol/L (ref 20–29)
CREATININE: 0.69 mg/dL — AB (ref 0.76–1.27)
Chloride: 102 mmol/L (ref 96–106)
GFR calc Af Amer: 116 mL/min/{1.73_m2} (ref >59)
GFR, EST NON AFRICAN AMERICAN: 100 mL/min/{1.73_m2} (ref >59)
GLOBULIN, TOTAL: 3.8 g/dL (ref 1.5–4.5)
Glucose: 95 mg/dL (ref 65–99)
POTASSIUM: 4.2 mmol/L (ref 3.5–5.2)
SODIUM: 138 mmol/L (ref 134–144)
Total Protein: 7.8 g/dL (ref 6.0–8.5)

## 2017-09-18 LAB — HEPATITIS PANEL, ACUTE
HEP B C IGM: NEGATIVE
HEP B S AG: NEGATIVE
Hep A IgM: NEGATIVE

## 2017-09-18 LAB — PSA: Prostate Specific Ag, Serum: 1.5 ng/mL (ref 0.0–4.0)

## 2017-09-18 LAB — CYCLIC CITRUL PEPTIDE ANTIBODY, IGG/IGA: Cyclic Citrullin Peptide Ab: >250 units — ABNORMAL HIGH (ref 0–19)

## 2017-09-18 LAB — FECAL OCCULT BLOOD, IMMUNOCHEMICAL: Fecal Occult Bld: NEGATIVE

## 2017-09-18 LAB — SEDIMENTATION RATE: SED RATE: 77 mm/h — AB (ref 0–30)

## 2017-09-18 LAB — TSH: TSH: 4 u[IU]/mL (ref 0.450–4.500)

## 2017-09-18 LAB — C-REACTIVE PROTEIN: CRP: 5.4 mg/L — AB (ref 0.0–4.9)

## 2017-09-23 ENCOUNTER — Telehealth (INDEPENDENT_AMBULATORY_CARE_PROVIDER_SITE_OTHER): Payer: Self-pay

## 2017-09-23 NOTE — Telephone Encounter (Signed)
Patient is aware of negative hepatitis, TB, FIT normal PSA and only inflammation due to his RA. [name], CMA   Patient was provided results by [employer] Staff- Rosalio Loud. [name], CMA

## 2017-09-23 NOTE — Telephone Encounter (Signed)
-----   Message from Loletta Specter, PA-C sent at 09/19/2017  1:11 PM EDT ----- FIT negative, PSA normal, TB negative, Hep panel negative. Only inflammation due to his RA (rheumatoid arthritis).

## 2017-09-29 ENCOUNTER — Ambulatory Visit: Payer: Self-pay

## 2017-10-13 ENCOUNTER — Ambulatory Visit: Payer: Self-pay | Attending: Family Medicine

## 2017-10-15 ENCOUNTER — Encounter (INDEPENDENT_AMBULATORY_CARE_PROVIDER_SITE_OTHER): Payer: Self-pay | Admitting: Physician Assistant

## 2017-10-15 ENCOUNTER — Ambulatory Visit (INDEPENDENT_AMBULATORY_CARE_PROVIDER_SITE_OTHER): Payer: Self-pay | Admitting: Physician Assistant

## 2017-10-15 VITALS — BP 146/78 | HR 61 | Temp 98.1°F | Ht 70.0 in | Wt 172.4 lb

## 2017-10-15 DIAGNOSIS — Z114 Encounter for screening for human immunodeficiency virus [HIV]: Secondary | ICD-10-CM

## 2017-10-15 DIAGNOSIS — M069 Rheumatoid arthritis, unspecified: Secondary | ICD-10-CM

## 2017-10-15 DIAGNOSIS — Z79899 Other long term (current) drug therapy: Secondary | ICD-10-CM

## 2017-10-15 DIAGNOSIS — I10 Essential (primary) hypertension: Secondary | ICD-10-CM

## 2017-10-15 MED ORDER — FOLIC ACID 1 MG PO TABS: 1.0000 mg | ORAL_TABLET | Freq: Every day | ORAL | 5 refills | Status: AC

## 2017-10-15 MED ORDER — SULFASALAZINE 500 MG PO TABS: 500.0000 mg | ORAL_TABLET | Freq: Every day | ORAL | 5 refills | Status: AC

## 2017-10-15 MED ORDER — LISINOPRIL 40 MG PO TABS: 40.0000 mg | ORAL_TABLET | Freq: Every day | ORAL | 11 refills | Status: DC

## 2017-10-15 NOTE — Patient Instructions (Signed)
  Artritis reumatoide (Rheumatoid Arthritis) La artritis reumatoide (AR) es una enfermedad a largo plazo (crnica). La AR causa inflamacin en las articulaciones. Las articulaciones pueden estar doloridas, rgidas, hinchadas, calientes o sensibles a la palpacin. La AR puede comenzar lentamente. A menudo, afecta las articulaciones pequeas de las manos y los pies. Tambin puede afectar otras partes del cuerpo, incluso el corazn, los ojos o los pulmones. Los sntomas de la AR suelen ser intermitentes. En ocasiones, los sntomas empeoran durante un perodo. Estos perodos se denominan crisis. La AR no tiene [place], pero el mdico trabajar con usted para Clinical research associate la opcin de tratamiento ms [place] para su caso. Esto depender de la evolucin de la enfermedad en su organismo. CUIDADOS EN EL HOGAR  Tome los medicamentos de venta libre y los recetados solamente como se lo haya indicado el mdico. El mdico puede ajustarle los medicamentos cada .  Comience un programa de actividad fsica segn las indicaciones del mdico.  Descanse cuando tenga una crisis.  Retome sus actividades habituales como se lo haya indicado el mdico. Pregntele al mdico qu actividades son seguras para usted.  Concurra a todas las visitas de control como se lo haya indicado el mdico. Esto es importante. SOLICITE AYUDA SI:  Tiene una crisis.  Tiene fiebre.  Tiene problemas (efectos secundarios) debido a los medicamentos. SOLICITE AYUDA DE INMEDIATO SI:  Siente dolor en el pecho.  Tiene dificultad para respirar.  Tiene calor y dolor en una articulacin de forma repentina, y esto es ms intenso que sus dolores articulares habituales. Esta informacin no tiene Theme park manager el consejo del mdico. Asegrese de hacerle al mdico cualquier pregunta que tenga. Document Released: 12/03/2011 Document Revised: 09/25/2015 Document Reviewed: 03/16/2015 Elsevier Interactive Patient Education  [employer].

## 2017-10-15 NOTE — Progress Notes (Signed)
Subjective:  Patient ID: Bill Gutierrez, male    DOB: 1951-06-14  Age: 67 y.o. MRN: [id number]  CC: f/u RA  HPI  [name] is a 67 y.o. male with a medical history of anixety, palpitations, HTN, prediabetes, and rheumatoid arthritis presents to f/u on HTN and RA. Has been taking Sulfasalazine 500 mg and folic acid as indicated. Feels his upper extremity range of motion increased bilaterally with decrease of pain and lower abdominal pain has resolved. He was referred to rheumatologist but was denied consult despite having active CAFA. No explanation provided as to why patient denied. Patient renewed his CAFA and is hoping he can find a way to be seen by a rheumatologist.      BP is mildly better than previous on lisinopril 10mg . Does not endorse side effect to taking lisinopril. Does not endorse CP, palpitations, SOB, HA, abdominal pain, f/c/n/v, rash, swelling, or GI/GU sxs.    Outpatient Medications Prior to Visit  Medication Sig Dispense Refill  . folic acid (FOLVITE) 1 MG tablet Take 1 tablet (1 mg total) by mouth daily. 30 tablet 2  . lisinopril (PRINIVIL,ZESTRIL) 10 MG tablet Take 1 tablet (10 mg total) by mouth daily. 30 tablet 11  . sulfaSALAzine (AZULFIDINE) 500 MG tablet Take 1 tablet (500 mg total) by mouth daily. 30 tablet 2   No facility-administered medications prior to visit.      ROS Review of Systems  Constitutional: Negative for chills, fever and malaise/fatigue.  Eyes: Negative for blurred vision.  Respiratory: Negative for shortness of breath.   Cardiovascular: Negative for chest pain and palpitations.  Gastrointestinal: Negative for abdominal pain and nausea.  Genitourinary: Negative for dysuria and hematuria.  Musculoskeletal: Positive for joint pain. Negative for myalgias.       Mild   Skin: Negative for rash.  Neurological: Negative for tingling and headaches.  Psychiatric/Behavioral: Negative for depression. The patient is not nervous/anxious.      Objective:  BP (!) 146/78 (BP Location: Left Arm, Patient Position: Sitting, Cuff Size: Normal)   Pulse 61   Temp 98.1 F (36.7 C) (Oral)   Ht 5\' 10"  (1.778 m)   Wt 172 lb 6.4 oz (78.2 kg)   SpO2 95%   BMI 24.74 kg/m   BP/Weight 10/15/2017 09/15/2017 11/12/2016  Systolic BP 146 150 140  Diastolic BP 78 87 91  Wt. (Lbs) 172.4 170.4 -  BMI 24.74 24.45 -      Physical Exam  Constitutional: He is oriented to person, place, and time.  Well developed, well nourished, NAD, polite  HENT:  Head: Normocephalic and atraumatic.  Eyes: No scleral icterus.  Neck: Normal range of motion. Neck supple. No thyromegaly present.  Cardiovascular: Normal rate, regular rhythm and normal heart sounds.  Pulmonary/Chest: Effort normal and breath sounds normal.  Abdominal: Soft. Bowel sounds are normal. There is no tenderness.  Musculoskeletal: He exhibits no edema.  All fingers with ulnar deviation.   Neurological: He is alert and oriented to person, place, and time.  Skin: Skin is warm and dry. No rash noted. No erythema. No pallor.  Psychiatric: He has a normal mood and affect. His behavior is normal. Thought content normal.  Vitals reviewed.    Assessment & Plan:   1. Hypertension, unspecified type - Increase lisinopril (PRINIVIL,ZESTRIL) 40 MG tablet; Take 1 tablet (40 mg total) by mouth daily.  Dispense: 30 tablet; Refill: 11  2. Rheumatoid arthritis involving both hands, unspecified rheumatoid factor presence (HCC) - Refill  sulfaSALAzine (AZULFIDINE) 500 MG tablet; Take 1 tablet (500 mg total) by mouth daily.  Dispense: 30 tablet; Refill: 5 - Refill folic acid (FOLVITE) 1 MG tablet; Take 1 tablet (1 mg total) by mouth daily.  Dispense: 30 tablet; Refill: 5 - I have called Dr. Fatima Sanger, Rheumatology, office to ask for an explanation of why patient was denied a consult. This has been an ongoing issue with [facility] patients.   3. Screening for HIV (human immunodeficiency virus) - HIV  antibody   Meds ordered this encounter  Medications  . lisinopril (PRINIVIL,ZESTRIL) 40 MG tablet    Sig: Take 1 tablet (40 mg total) by mouth daily.    Dispense:  30 tablet    Refill:  11    Order Specific Question:   Supervising Provider    Answer:   Quentin Angst [id number]  . sulfaSALAzine (AZULFIDINE) 500 MG tablet    Sig: Take 1 tablet (500 mg total) by mouth daily.    Dispense:  30 tablet    Refill:  5    Order Specific Question:   Supervising Provider    Answer:   Quentin Angst [id number]  . folic acid (FOLVITE) 1 MG tablet    Sig: Take 1 tablet (1 mg total) by mouth daily.    Dispense:  30 tablet    Refill:  5    Order Specific Question:   Supervising Provider    Answer:   Quentin Angst [id number]    Follow-up: Return in about 8 weeks (around 12/10/2017).   Loletta Specter PA

## 2017-10-16 LAB — HEPATIC FUNCTION PANEL
ALT: 18 IU/L (ref 0–44)
AST: 23 IU/L (ref 0–40)
Albumin: 3.8 g/dL (ref 3.6–4.8)
Alkaline Phosphatase: 102 IU/L (ref 39–117)
Bilirubin Total: 0.3 mg/dL (ref 0.0–1.2)
Bilirubin, Direct: 0.09 mg/dL (ref 0.00–0.40)
Total Protein: 7.5 g/dL (ref 6.0–8.5)

## 2017-10-16 LAB — HIV ANTIBODY (ROUTINE TESTING W REFLEX): HIV Screen 4th Generation wRfx: NONREACTIVE

## 2017-10-17 ENCOUNTER — Telehealth (INDEPENDENT_AMBULATORY_CARE_PROVIDER_SITE_OTHER): Payer: Self-pay

## 2017-10-17 NOTE — Telephone Encounter (Signed)
Patient is aware of normal liver results and negative HIV. Bill Gutierrez, CMA   [employer] interpreter [phone])

## 2017-10-17 NOTE — Telephone Encounter (Signed)
-----   Message from Loletta Specter, PA-C sent at 10/16/2017  8:36 AM EDT ----- Liver results normal, negative HIV.

## 2017-12-10 ENCOUNTER — Other Ambulatory Visit: Payer: Self-pay

## 2017-12-10 ENCOUNTER — Ambulatory Visit (INDEPENDENT_AMBULATORY_CARE_PROVIDER_SITE_OTHER): Payer: Self-pay | Admitting: Physician Assistant

## 2017-12-10 ENCOUNTER — Encounter (INDEPENDENT_AMBULATORY_CARE_PROVIDER_SITE_OTHER): Payer: Self-pay | Admitting: Physician Assistant

## 2017-12-10 VITALS — BP 154/89 | HR 64 | Temp 97.9°F | Ht 70.0 in | Wt 172.2 lb

## 2017-12-10 DIAGNOSIS — I10 Essential (primary) hypertension: Secondary | ICD-10-CM

## 2017-12-10 DIAGNOSIS — M069 Rheumatoid arthritis, unspecified: Secondary | ICD-10-CM

## 2017-12-10 DIAGNOSIS — Z79899 Other long term (current) drug therapy: Secondary | ICD-10-CM

## 2017-12-10 NOTE — Progress Notes (Signed)
Subjective:  Patient ID: Bill Gutierrez, male    DOB: 08/18/1950  Age: 67 y.o. MRN: [id number]  CC: f/u HTN and RA  HPI Bill Gutierrez a 67 y.o.malewith a medical history of anixety, palpitations, HTN, prediabetes, and rheumatoid arthritis presents to f/u on HTN and RA. Last BP 146/78 mmHg nearly two months ago. BP 154/98 mmHg initially. Retake . Patient had lisinopril increased to 40 mg from 20 mg but he states that he continues to take 20 mg. Says his blood pressure monitor at home usually reads in the 120s systolic. Denies CP, palpitations, SOB, HA, tingling, numnbess    Pt states he is feeling much better in regards to RA. Taking sulfasalazine 500 mg and folic acid 1 mg daily. He is able to move joints more freely with less stiffness, able to lift items such as grocery bags, less intense pelvic pain, generalized swelling of joints has subsided, and no longer feels that his eyes have "spider webs". Still has not been able to see rheumatologist as he has been accepted as a patient. Has rheumatoid deformity of the toes as well and as a consequence, the plantar aspect of the forefoot protrudes and strikes the ground earlier than the rest of his foot. The deformity causes pain with walking and standing. Does not endorse any other complaints or symptoms .      Outpatient Medications Prior to Visit  Medication Sig Dispense Refill  . folic acid (FOLVITE) 1 MG tablet Take 1 tablet (1 mg total) by mouth daily. 30 tablet 5  . lisinopril (PRINIVIL,ZESTRIL) 40 MG tablet Take 1 tablet (40 mg total) by mouth daily. 30 tablet 11  . sulfaSALAzine (AZULFIDINE) 500 MG tablet Take 1 tablet (500 mg total) by mouth daily. 30 tablet 5   No facility-administered medications prior to visit.      ROS Review of Systems  Constitutional: Negative for chills, fever and malaise/fatigue.  Eyes: Negative for blurred vision.  Respiratory: Negative for shortness of breath.   Cardiovascular: Negative  for chest pain and palpitations.  Gastrointestinal: Negative for abdominal pain and nausea.  Genitourinary: Negative for dysuria and hematuria.  Musculoskeletal: Positive for joint pain. Negative for myalgias.  Skin: Negative for rash.  Neurological: Negative for tingling and headaches.  Psychiatric/Behavioral: Negative for depression. The patient is not nervous/anxious.     Objective:  BP (!) 154/89 (BP Location: Left Arm, Patient Position: Sitting, Cuff Size: Normal)   Pulse 64   Temp 97.9 F (36.6 C) (Oral)   Ht 5\' 10"  (1.778 m)   Wt 172 lb 3.2 oz (78.1 kg)   SpO2 98%   BMI 24.71 kg/m   BP/Weight 12/10/2017 10/15/2017 09/15/2017  Systolic BP 154 146 150  Diastolic BP 89 78 87  Wt. (Lbs) 172.2 172.4 170.4  BMI 24.71 24.74 24.45      Physical Exam  Constitutional: He is oriented to person, place, and time.  Well developed, well nourished, NAD, polite  HENT:  Head: Normocephalic and atraumatic.  Eyes: No scleral icterus.  Neck: Normal range of motion. Neck supple. No thyromegaly present.  Cardiovascular: Normal rate, regular rhythm and normal heart sounds.  Pulmonary/Chest: Effort normal and breath sounds normal.  Abdominal: Soft. Bowel sounds are normal. He exhibits no distension. There is no tenderness.  Musculoskeletal: He exhibits deformity (of hands and feet). He exhibits no edema.  Neurological: He is alert and oriented to person, place, and time.  Skin: Skin is warm and dry. No rash noted. No  erythema. No pallor.  Psychiatric: He has a normal mood and affect. His behavior is normal. Thought content normal.  Vitals reviewed.    Assessment & Plan:   1. Hypertension, unspecified type - Comprehensive metabolic panel - CBC with Differential  2. Rheumatoid arthritis involving both hands, unspecified rheumatoid factor presence (HCC) - Ambulatory referral to Rheumatology - Comprehensive metabolic panel - CBC with Differential - Continue on sulfasalazine and folic  acid  3. High risk medication use - Ambulatory referral to Rheumatology - Comprehensive metabolic panel - CBC with Differential     Follow-up: Return in about 2 months (around 02/09/2018) for RA.   Loletta Specter PA

## 2017-12-10 NOTE — Patient Instructions (Signed)
Sulfasalazine tablets Qu es este medicamento? La SULFASALAZINA se utiliza para tratar la colitis ulcerosa. Este medicamento puede ser utilizado para otros usos; si tiene alguna pregunta consulte con su proveedor de atencin mdica o con su farmacutico. MARCAS COMUNES: Azulfidine, Sulfazine Qu le debo informar a mi profesional de la salud antes de tomar este medicamento? Necesita saber si usted presenta alguno de los siguientes problemas o situaciones: -asma -anemia o trastornos sanguneos -deficiencia de glucosa-6-fosfato deshidrogenasa (L-6PD) -obstruccin intestinal -enfermedad renal -enfermedad heptica -porfiria -obstruccin del tracto urinario -una reaccin alrgica o inusual a la sulfasalazina, las sulfonamidas, salicilatos, a otros medicamentos, alimentos, colorantes o conservantes -si est embarazada o buscando quedar embarazada -si est amamantando a un beb Cmo debo utilizar este medicamento? Tome este medicamento por va oral con un vaso lleno de agua. Siga las instrucciones de la etiqueta del [place]. Si este Social worker, tmelo con alimentos o con [employer]. Tome sus dosis a intervalos regulares. No tome su medicamento con una frecuencia mayor a la indicada. No deje de tomarlo excepto si as lo indica su mdico. Hable con su pediatra para informarse acerca del uso de este medicamento en nios. Aunque este medicamento ha sido recetado a nios tan menores como de 6 aos de edad para condiciones selectivas, las precauciones se aplican. Los pacientes de ms de 65 aos de edad pueden presentar reacciones ms fuertes y Pension scheme manager dosis [employer]. Sobredosis: Pngase en contacto inmediatamente con un centro toxicolgico o una sala de urgencia si usted cree que haya tomado demasiado medicamento. ATENCIN: [employer] es solo para usted. No comparta este medicamento con nadie. Qu sucede si me olvido de una dosis? Si olvida una dosis, tmela lo  antes posible. Si es casi la hora de la prxima dosis, tome slo esa dosis. No tome dosis adicionales o dobles. Qu puede interactuar con este medicamento? -digoxina -cido flico Puede ser que esta lista no menciona todas las posibles interacciones. Informe a su profesional de [employer] de [employer] productos a base de hierbas, medicamentos de [place] [facility] o suplementos nutritivos que est tomando. Si usted fuma, consume bebidas alcohlicas o si utiliza drogas ilegales, indqueselo tambin a su profesional de [employer]. Algunas sustancias pueden interactuar con su medicamento. A qu debo estar atento al usar [employer]? Visite a su mdico o a su profesional de la salud para chequear su evolucin peridicamente. Necesitar que le realicen chequeos de la sangre y de la orina con frecuencia. Este medicamento puede aumentar la sensibilidad al sol. Mantngase fuera de Secretary/administrator. Si no lo puede evitar, utilice ropa protectora y crema de Orthoptist. No utilice lmparas solares, camas solares ni cabinas solares. Asegrese de beber agua en abundancia mientras est tomando este medicamento. Qu efectos secundarios puedo tener al [employer] este medicamento? Efectos secundarios que debe informar a su mdico o a Producer, television/film/video de la salud tan pronto como sea posible: -Therapist, art como erupcin cutnea, picazn o urticarias, hinchazn de la cara, labios o lengua -fiebre, escalofros o cualquier otro sntoma de infeccin -dolor, dificultad o reduccin del volumen al orinar -enrojecimiento, formacin de ampollas, descamacin o distensin de la piel, inclusive dentro de la boca -dolor de Teaching laboratory technician severo -sangrado o magulladuras inusuales -cansancio o debilidad inusual -color amarillento de los ojos o la piel Efectos secundarios que, por lo general, no requieren Psychologist, prison and probation services (debe informarlos a su mdico o a su profesional de la salud si persisten o si son molestos): -dolor de  cabeza -prdida del apetito -  nuseas, vmito -decoloracin de la orina de color naranja -reduccin en el conteo de espermatozoides Puede ser que esta lista no menciona todos los posibles efectos secundarios. Comunquese a su mdico por asesoramiento mdico [employer]. Usted puede informar los efectos secundarios a la FDA por telfono al 1-800-FDA-1088. Dnde debo guardar mi medicina? Mantngala fuera del alcance de los nios. Gurdela a [employer], entre 15 y 30 grados C (67 y 31 grados F). Mantenga el envase bien cerrado. Deseche los medicamentos que no haya utilizado, despus de la fecha de vencimiento. ATENCIN: Este folleto es un resumen. Puede ser que no cubra toda la posible informacin. Si usted tiene preguntas acerca de esta medicina, consulte con su mdico, su farmacutico o su profesional de Radiographer, therapeutic.  2018 Elsevier/Gold Standard (2014-07-26 00:00:00)

## 2017-12-11 LAB — COMPREHENSIVE METABOLIC PANEL
ALBUMIN: 4 g/dL (ref 3.6–4.8)
ALK PHOS: 99 IU/L (ref 39–117)
ALT: 14 IU/L (ref 0–44)
AST: 19 IU/L (ref 0–40)
Albumin/Globulin Ratio: 1 — ABNORMAL LOW (ref 1.2–2.2)
BUN / CREAT RATIO: 15 (ref 10–24)
BUN: 11 mg/dL (ref 8–27)
Bilirubin Total: 0.4 mg/dL (ref 0.0–1.2)
CO2: 23 mmol/L (ref 20–29)
CREATININE: 0.75 mg/dL — AB (ref 0.76–1.27)
Calcium: 8.5 mg/dL — ABNORMAL LOW (ref 8.6–10.2)
Chloride: 102 mmol/L (ref 96–106)
GFR, EST AFRICAN AMERICAN: 112 mL/min/{1.73_m2} (ref >59)
GFR, EST NON AFRICAN AMERICAN: 97 mL/min/{1.73_m2} (ref >59)
GLOBULIN, TOTAL: 3.9 g/dL (ref 1.5–4.5)
Glucose: 99 mg/dL (ref 65–99)
Potassium: 4.3 mmol/L (ref 3.5–5.2)
SODIUM: 137 mmol/L (ref 134–144)
TOTAL PROTEIN: 7.9 g/dL (ref 6.0–8.5)

## 2017-12-11 LAB — CBC WITH DIFFERENTIAL/PLATELET
BASOS: 0 %
Basophils Absolute: 0 10*3/uL (ref 0.0–0.2)
EOS (ABSOLUTE): 0.4 10*3/uL (ref 0.0–0.4)
EOS: 6 %
HEMATOCRIT: 43.3 % (ref 37.5–51.0)
HEMOGLOBIN: 13.7 g/dL (ref 13.0–17.7)
Immature Grans (Abs): 0 10*3/uL (ref 0.0–0.1)
Immature Granulocytes: 0 %
LYMPHS ABS: 1.8 10*3/uL (ref 0.7–3.1)
Lymphs: 26 %
MCH: 24.5 pg — AB (ref 26.6–33.0)
MCHC: 31.6 g/dL (ref 31.5–35.7)
MCV: 78 fL — AB (ref 79–97)
Monocytes Absolute: 0.4 10*3/uL (ref 0.1–0.9)
Monocytes: 6 %
NEUTROS ABS: 4.3 10*3/uL (ref 1.4–7.0)
Neutrophils: 62 %
Platelets: 339 10*3/uL (ref 150–450)
RBC: 5.59 x10E6/uL (ref 4.14–5.80)
RDW: 16 % — ABNORMAL HIGH (ref 12.3–15.4)
WBC: 7 10*3/uL (ref 3.4–10.8)

## 2017-12-12 ENCOUNTER — Telehealth: Payer: Self-pay

## 2017-12-12 NOTE — Telephone Encounter (Signed)
No answer, no option to leave message. Will try to call once more before mailing results. [name], CMA    Call made using pacific interpreter tonya [phone])

## 2017-12-12 NOTE — Telephone Encounter (Signed)
-----   Message from Loletta Specter, PA-C sent at 12/11/2017  8:59 AM EDT ----- Labs unremarkable.

## 2017-12-24 ENCOUNTER — Telehealth (INDEPENDENT_AMBULATORY_CARE_PROVIDER_SITE_OTHER): Payer: Self-pay

## 2017-12-24 NOTE — Telephone Encounter (Signed)
Called placed using pacific interpreter- juan [phone]) patient is aware of lab results. [name], CMA

## 2017-12-24 NOTE — Telephone Encounter (Signed)
-----   Message from Roger David Gomez, PA-C sent at 12/11/2017  8:59 AM EDT ----- Labs unremarkable. 

## 2017-12-29 ENCOUNTER — Ambulatory Visit: Payer: No Typology Code available for payment source | Admitting: Podiatry

## 2017-12-29 ENCOUNTER — Ambulatory Visit: Payer: Self-pay

## 2017-12-29 ENCOUNTER — Encounter: Payer: Self-pay | Admitting: Podiatry

## 2017-12-29 VITALS — BP 157/92 | HR 70

## 2017-12-29 DIAGNOSIS — M05771 Rheumatoid arthritis with rheumatoid factor of right ankle and foot without organ or systems involvement: Secondary | ICD-10-CM

## 2017-12-29 DIAGNOSIS — M05772 Rheumatoid arthritis with rheumatoid factor of left ankle and foot without organ or systems involvement: Secondary | ICD-10-CM

## 2017-12-29 DIAGNOSIS — R52 Pain, unspecified: Secondary | ICD-10-CM

## 2017-12-29 DIAGNOSIS — M19079 Primary osteoarthritis, unspecified ankle and foot: Secondary | ICD-10-CM

## 2018-01-11 NOTE — Progress Notes (Signed)
   HPI: 67 year old male prediabetic with a history of established rheumatoid arthritis presents for evaluation of bilateral foot pain.  The pain has slowly increased to her currently it is only at 2/10 on a daily basis.  Aggravating factor is walking or standing for prolonged period of time.  Patient complains of deformity in his bilateral feet secondary to the rheumatoid arthritis.  This is been going on for approximately 10 years now.  The patient's main concern today is instability during walking.  He says that he is not walking normal.   Past Medical History:  Diagnosis Date  . Hypertension 2010  . Prediabetes 08/29/2016  . rheumatoid 2010     Physical Exam: General: The patient is alert and oriented x3 in no acute distress.  Dermatology: Skin is warm, dry and supple bilateral lower extremities. Negative for open lesions or macerations.  Vascular: Palpable pedal pulses bilaterally. No edema or erythema noted. Capillary refill within normal limits.  Neurological: Epicritic and protective threshold grossly intact bilaterally.   Musculoskeletal Exam: Limited range of motion to the pedal joints of the foot with hammertoe contracture and digital deformity secondary to the rheumatoid arthritis.  Muscle strength 5/5 in all groups bilateral.   Radiographic Exam:  Normal osseous mineralization. No fracture/dislocation/boney destruction.  Degenerative changes noted throughout the pedal joints of the midfoot bilateral.  Dorsal dislocation of the MTPJ's bilateral.  Hammertoe contracture noted lateral.  Assessment: 1.  Rheumatoid foot type bilateral lower extremities 2.  History of rheumatoid arthritis 3.  DJD bilateral feet 4.  Hammertoe contracture bilateral   Plan of Care:  1. Patient evaluated. X-Rays reviewed.  2.  Today conservative treatment is recommended.  Especially since the patient only has very low grade pain on a daily basis.  Patient is concerned more for stability during  gait. 3.  Recommend good supportive walking shoes and over-the-counter insoles 4.  We did discuss surgery versus conservative management and I explained the patient that surgical intervention would only be necessary if the patient's symptoms become moderate to severe.  At the moment they are minimal. 5.  Return to clinic as needed      Felecia Shelling, DPM [facility]  Dr. Felecia Shelling, DPM    [address] [place], [place] [address]                Office [phone]  Fax [phone]

## 2018-01-12 ENCOUNTER — Ambulatory Visit: Payer: Self-pay | Attending: Physician Assistant

## 2018-02-10 ENCOUNTER — Ambulatory Visit (INDEPENDENT_AMBULATORY_CARE_PROVIDER_SITE_OTHER): Payer: Self-pay | Admitting: Physician Assistant

## 2018-03-10 ENCOUNTER — Ambulatory Visit (INDEPENDENT_AMBULATORY_CARE_PROVIDER_SITE_OTHER): Payer: Self-pay | Admitting: Physician Assistant

## 2018-05-18 MED FILL — LISINOPRIL 40 MG TABLET: 40 | 30 days supply | Qty: 30 | Fill #0

## 2018-08-19 ENCOUNTER — Ambulatory Visit: Payer: Self-pay | Attending: Family Medicine

## 2018-08-25 MED FILL — LISINOPRIL 40 MG TABLET: 40 | 30 days supply | Qty: 30 | Fill #1

## 2018-09-01 ENCOUNTER — Ambulatory Visit (INDEPENDENT_AMBULATORY_CARE_PROVIDER_SITE_OTHER): Payer: Self-pay | Admitting: Primary Care

## 2018-09-18 ENCOUNTER — Telehealth: Payer: Self-pay | Admitting: Physician Assistant

## 2018-09-18 NOTE — Telephone Encounter (Signed)
Pt would like to know if he should take the pneumonia shot to help prevent with covid  Please follow up

## 2018-09-21 NOTE — Telephone Encounter (Signed)
Attempt to call patient with Spanish interpreter. Unable to reach. Assistance provided by Marylene [phone]

## 2018-09-22 ENCOUNTER — Ambulatory Visit: Payer: Self-pay | Admitting: Primary Care

## 2018-09-22 ENCOUNTER — Ambulatory Visit (INDEPENDENT_AMBULATORY_CARE_PROVIDER_SITE_OTHER): Payer: Self-pay | Admitting: Primary Care

## 2018-09-30 ENCOUNTER — Telehealth: Payer: Self-pay | Admitting: Physician Assistant

## 2018-09-30 NOTE — Telephone Encounter (Addendum)
Patients call returned.  Patient identified by name and date of birth.  Patient was wanting a pneumonia vaccine to prevent COVID-19.  Patient informed that the pneumonia vaccine would not prevent COVID-19.  Patient acknowledged understanding of advise.

## 2018-09-30 NOTE — Telephone Encounter (Signed)
Patient returned missed call from last week in regards to the pneumonia shot. Please follow up.

## 2019-01-22 ENCOUNTER — Ambulatory Visit (INDEPENDENT_AMBULATORY_CARE_PROVIDER_SITE_OTHER): Payer: Self-pay | Admitting: Primary Care

## 2019-02-09 ENCOUNTER — Ambulatory Visit (INDEPENDENT_AMBULATORY_CARE_PROVIDER_SITE_OTHER): Payer: Self-pay | Admitting: Primary Care

## 2019-03-09 ENCOUNTER — Ambulatory Visit (INDEPENDENT_AMBULATORY_CARE_PROVIDER_SITE_OTHER): Payer: Self-pay | Admitting: Primary Care

## 2019-03-27 IMAGING — CR DG CHEST 2V
2 series · 2 of 2 positions shown · non-contrast
Comparison: None.

CLINICAL DATA: Mid thoracic back pain and palpitations today.
Hypertension. Ex-smoker.

EXAM:
CHEST  2 VIEW

[chest pa]
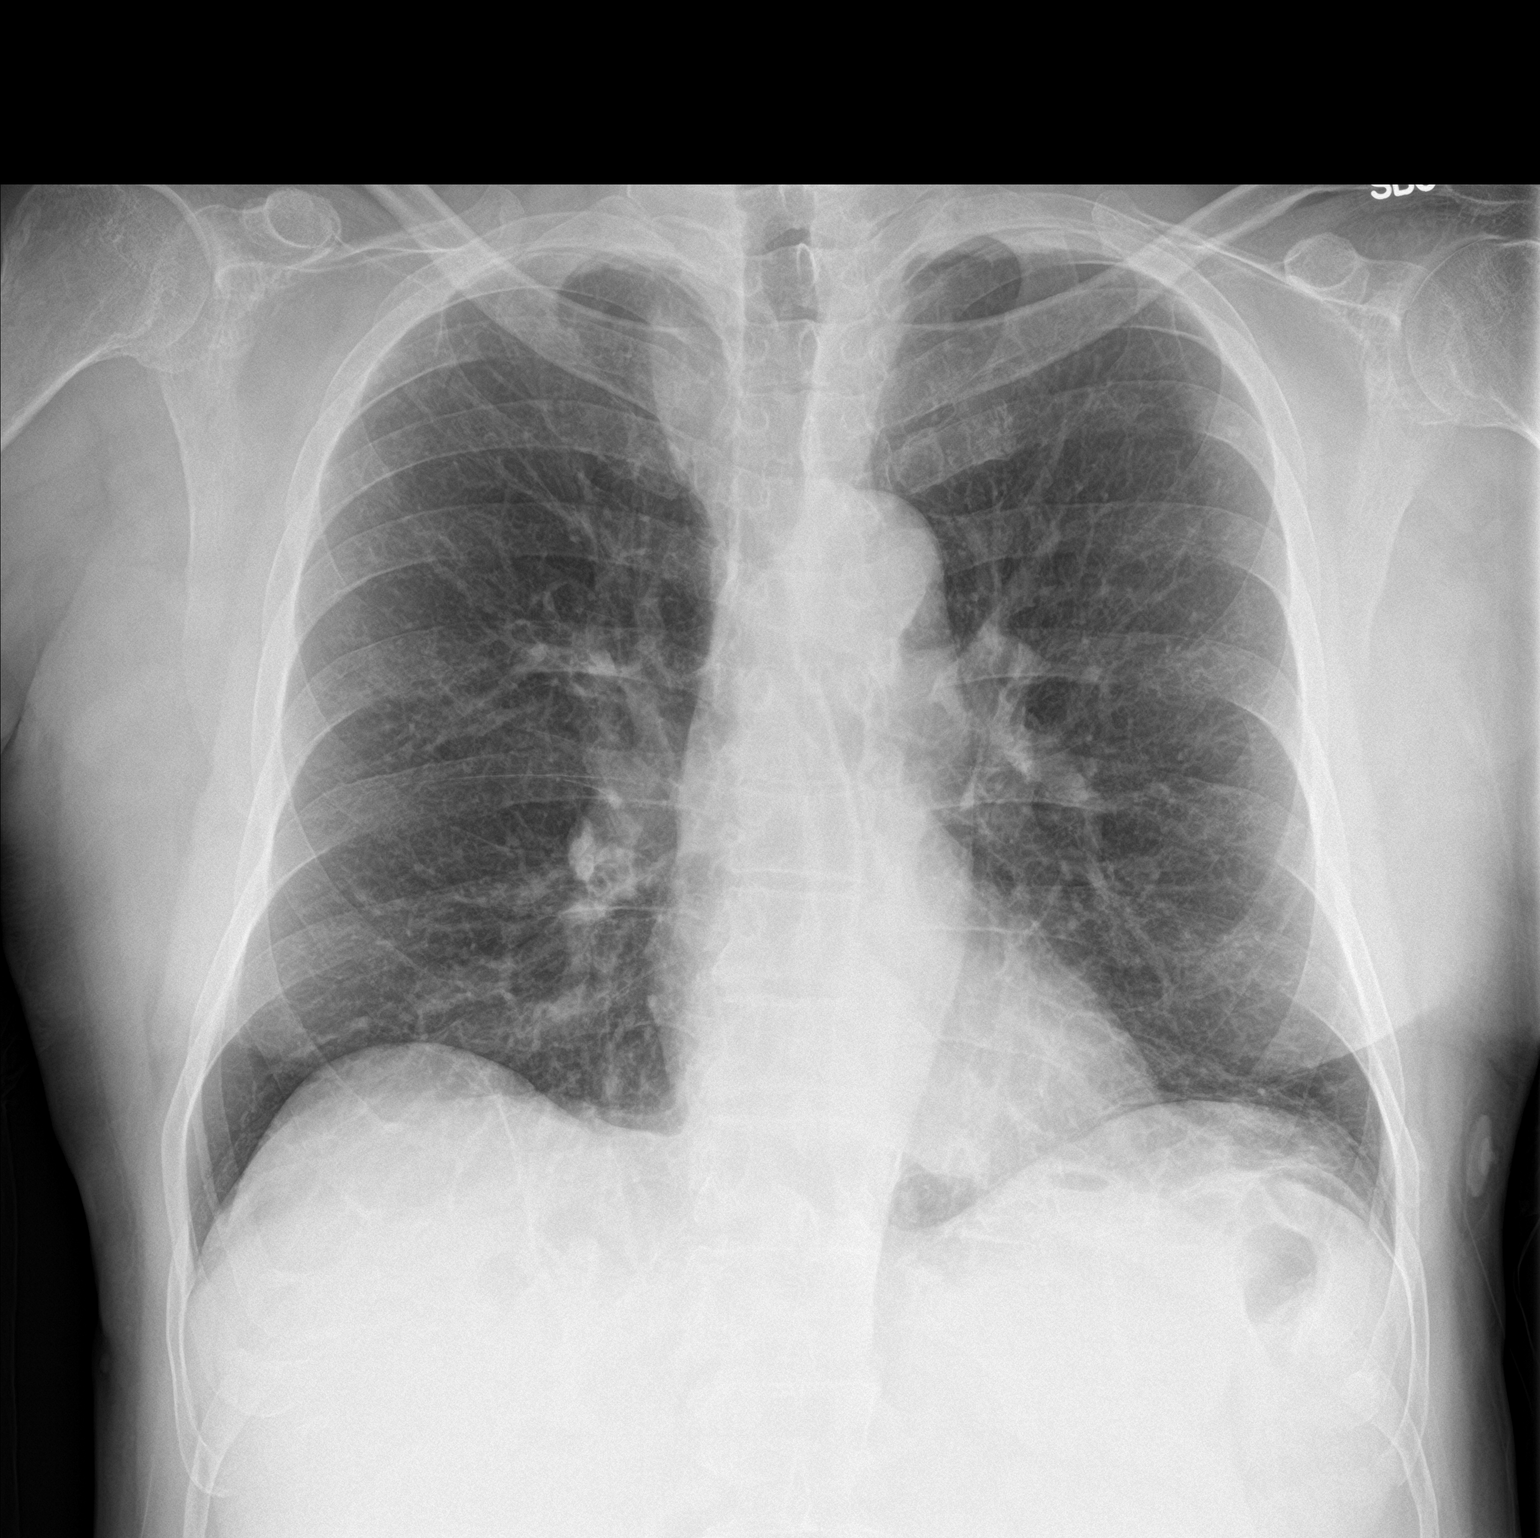

[chest lat]
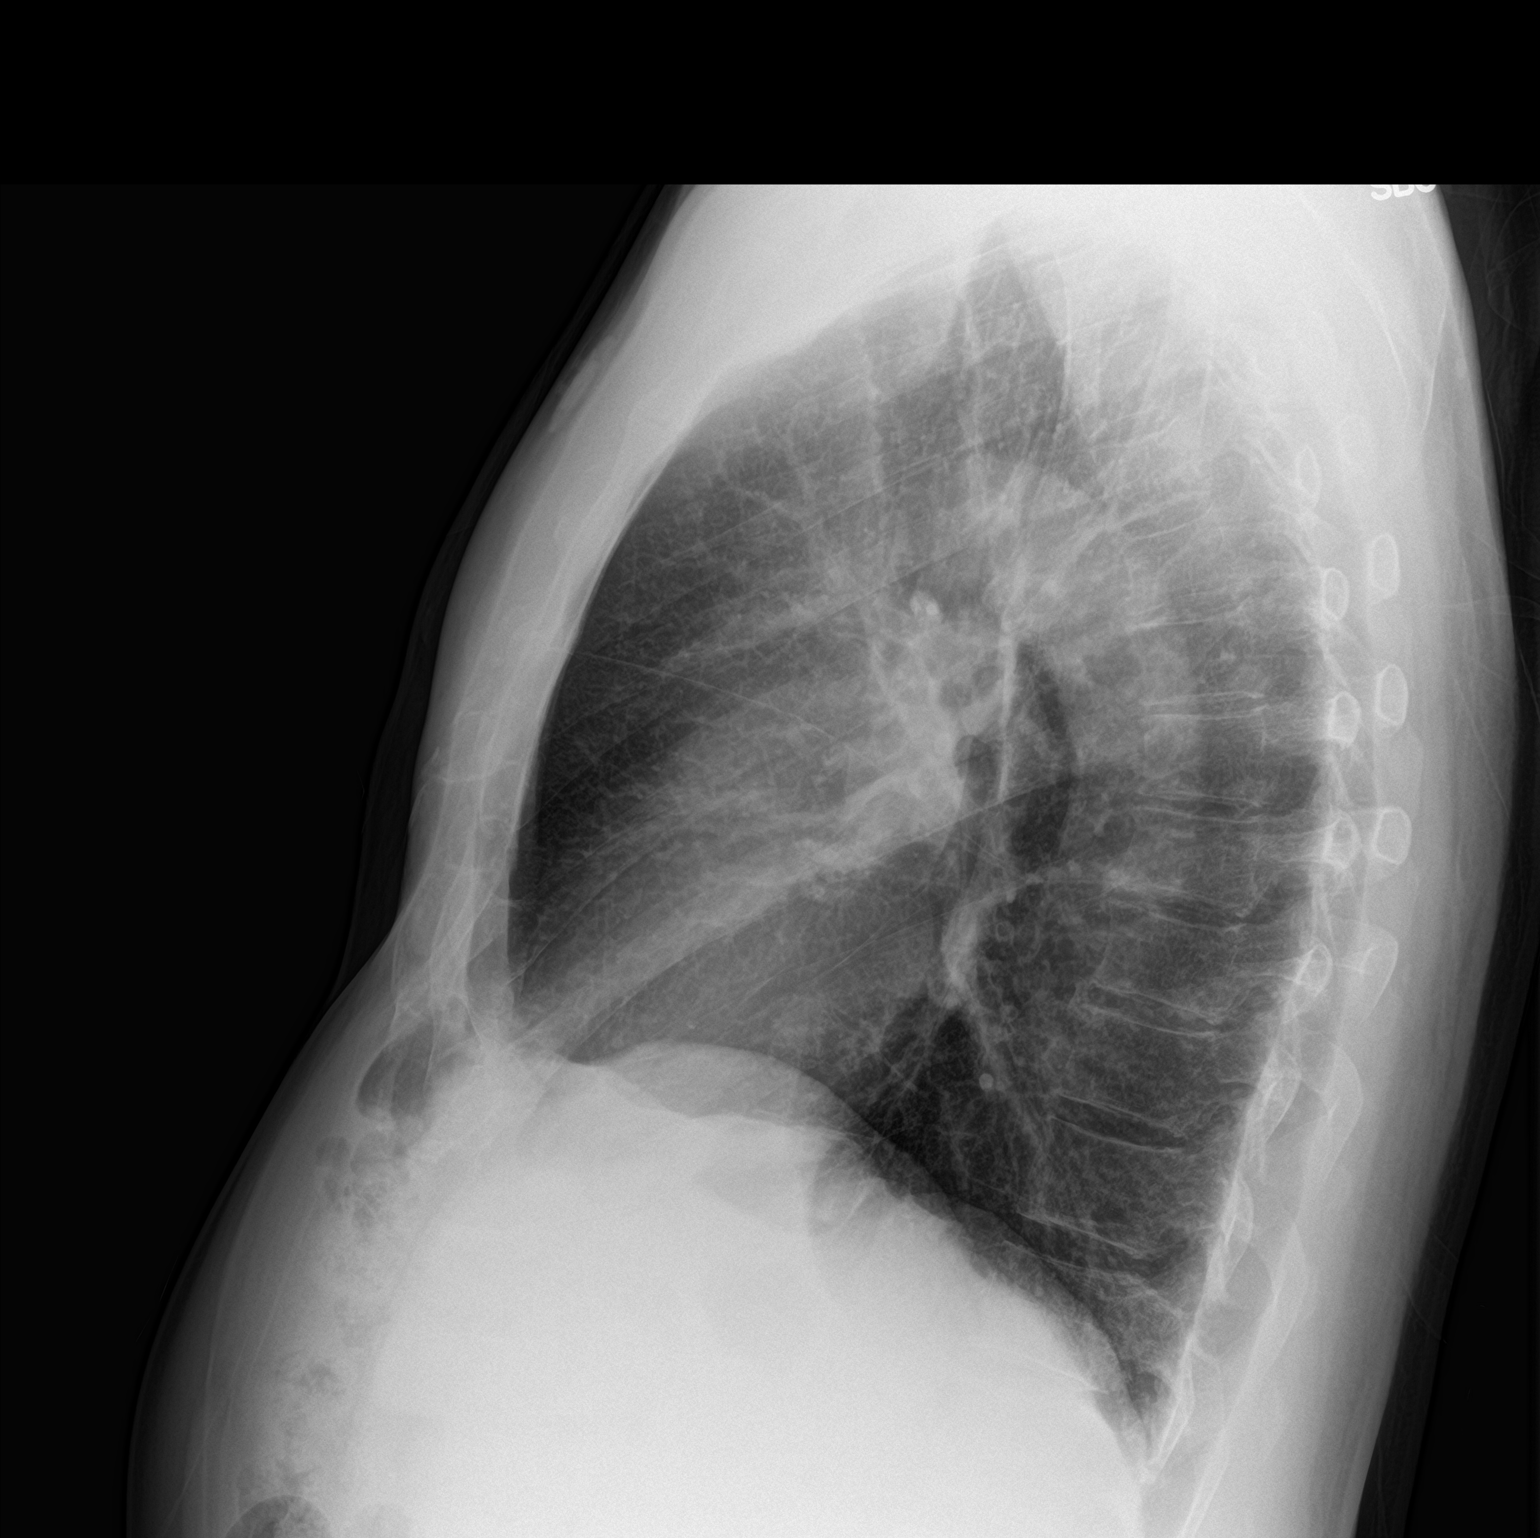

[2 of 2 positions shown; findings below may reference images not displayed]

FINDINGS: Normal sized heart. Tortuous aorta and innominate artery. Clear
lungs. Mild diffuse peribronchial thickening. Diffuse osteopenia.
IMPRESSION: No acute abnormality.  Mild chronic bronchitic changes.

## 2019-08-02 ENCOUNTER — Ambulatory Visit: Payer: Self-pay | Attending: Internal Medicine

## 2019-08-24 ENCOUNTER — Encounter: Payer: Self-pay | Admitting: Pediatric Intensive Care

## 2019-08-24 ENCOUNTER — Other Ambulatory Visit: Payer: Self-pay | Admitting: Critical Care Medicine

## 2019-08-24 DIAGNOSIS — I10 Essential (primary) hypertension: Secondary | ICD-10-CM

## 2019-08-24 MED ORDER — LISINOPRIL 40 MG PO TABS: 40.0000 mg | ORAL_TABLET | Freq: Every day | ORAL | 0 refills | Status: AC

## 2019-08-24 MED FILL — LISINOPRIL 40 MG TABLET: 40 | 30 days supply | Qty: 30 | Fill #0

## 2019-08-24 NOTE — Progress Notes (Signed)
Refill

## 2019-08-24 NOTE — Congregational Nurse Program (Signed)
  Dept: [phone]   Congregational Nurse Program Note  Date of Encounter: 08/24/2019  Past Medical History: Past Medical History:  Diagnosis Date  . Hypertension 2010  . Prediabetes 08/29/2016  . rheumatoid 2010    Encounter Details: Client has not been to PCP due to no [place] Card or CAFA. He has not had BP meds in several months. He also needs to re-establish PCP and refrral for RA management. Shann Medal RN BSN CNP [phone]

## 2020-03-21 ENCOUNTER — Telehealth: Payer: Self-pay | Admitting: Pediatric Intensive Care

## 2020-03-21 NOTE — Telephone Encounter (Signed)
Call from client- he wants information regarding 3rd COVID vaccinations. CN gave [facility] COVID vaccination number to schedule appointment. Cleint advised of flu sot clinic at [facility] on 10/12 from 3-6pm. Shann Medal RN BSN CNP [phone]

## 2020-03-22 ENCOUNTER — Telehealth: Payer: Self-pay | Admitting: Pediatric Intensive Care

## 2020-03-22 NOTE — Telephone Encounter (Signed)
Call to client- unable to leave VM. Shann Medal RN BSN CNP [phone]
